# Patient Record
Sex: Male | Born: 1995 | Race: Black or African American | Hispanic: No | Marital: Single | Smoking: Never smoker
Health system: Southern US, Community
[De-identification: ages and names within clinical notes are randomized; demographics above are authoritative.]

## PROBLEM LIST (undated history)

## (undated) DIAGNOSIS — J45909 Unspecified asthma, uncomplicated: Secondary | ICD-10-CM

---

## 2017-03-30 ENCOUNTER — Emergency Department (HOSPITAL_COMMUNITY): Payer: No Typology Code available for payment source

## 2017-03-30 ENCOUNTER — Other Ambulatory Visit: Payer: Self-pay

## 2017-03-30 ENCOUNTER — Emergency Department (HOSPITAL_COMMUNITY)
Admission: EM | Admit: 2017-03-30 | Discharge: 2017-03-31 | Disposition: A | Discharge status: Home / Self Care | Payer: No Typology Code available for payment source | Attending: Emergency Medicine | Admitting: Emergency Medicine

## 2017-03-30 ENCOUNTER — Encounter (HOSPITAL_COMMUNITY): Payer: Self-pay

## 2017-03-30 DIAGNOSIS — M545 Low back pain, unspecified: Secondary | ICD-10-CM

## 2017-03-30 DIAGNOSIS — Z202 Contact with and (suspected) exposure to infections with a predominantly sexual mode of transmission: Secondary | ICD-10-CM | POA: Insufficient documentation

## 2017-03-30 DIAGNOSIS — M542 Cervicalgia: Secondary | ICD-10-CM | POA: Insufficient documentation

## 2017-03-30 DIAGNOSIS — J45909 Unspecified asthma, uncomplicated: Secondary | ICD-10-CM | POA: Insufficient documentation

## 2017-03-30 DIAGNOSIS — Z711 Person with feared health complaint in whom no diagnosis is made: Secondary | ICD-10-CM

## 2017-03-30 HISTORY — DX: Unspecified asthma, uncomplicated: J45.909

## 2017-03-30 LAB — URINALYSIS, ROUTINE W REFLEX MICROSCOPIC
BILIRUBIN URINE: NEGATIVE
Glucose, UA: NEGATIVE mg/dL
HGB URINE DIPSTICK: NEGATIVE
Ketones, ur: NEGATIVE mg/dL
Leukocytes, UA: NEGATIVE
Nitrite: NEGATIVE
PH: 5 (ref 5.0–8.0)
Protein, ur: NEGATIVE mg/dL
SPECIFIC GRAVITY, URINE: 1.028 (ref 1.005–1.030)

## 2017-03-30 NOTE — ED Notes (Signed)
Patient transported to X-ray 
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
## 2017-03-30 NOTE — ED Notes (Signed)
Patient transported to CT 
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
## 2017-03-31 LAB — HIV ANTIBODY (ROUTINE TESTING W REFLEX): HIV SCREEN 4TH GENERATION: NONREACTIVE

## 2017-03-31 LAB — GC/CHLAMYDIA PROBE AMP (~~LOC~~) NOT AT ARMC
Chlamydia: NEGATIVE
Neisseria Gonorrhea: NEGATIVE

## 2017-03-31 LAB — RPR: RPR Ser Ql: NONREACTIVE

## 2017-03-31 MED ORDER — CYCLOBENZAPRINE HCL 10 MG PO TABS: 10.0000 mg | ORAL_TABLET | Freq: Two times a day (BID) | ORAL | 0 refills | Status: AC | PRN

## 2017-03-31 NOTE — Discharge Instructions (Signed)
Take Ibuprofen three times daily for the next week. Take this medicine with food. °Take muscle relaxer at bedtime to help you sleep. This medicine makes you drowsy so do not take before driving or work °Use a heating pad for sore muscles - use for 20 minutes several times a day °Return for worsening symptoms ° °

## 2017-03-31 NOTE — ED Notes (Signed)
ED Provider at bedside. 

## 2018-02-16 ENCOUNTER — Emergency Department (HOSPITAL_COMMUNITY)
Admission: EM | Admit: 2018-02-16 | Discharge: 2018-02-16 | Disposition: A | Discharge status: Home / Self Care | Payer: Medicaid Other | Attending: Emergency Medicine | Admitting: Emergency Medicine

## 2018-02-16 ENCOUNTER — Encounter (HOSPITAL_COMMUNITY): Payer: Self-pay | Admitting: Emergency Medicine

## 2018-02-16 ENCOUNTER — Other Ambulatory Visit: Payer: Self-pay

## 2018-02-16 DIAGNOSIS — Z5321 Procedure and treatment not carried out due to patient leaving prior to being seen by health care provider: Secondary | ICD-10-CM | POA: Insufficient documentation

## 2018-02-16 DIAGNOSIS — Z043 Encounter for examination and observation following other accident: Secondary | ICD-10-CM | POA: Insufficient documentation

## 2018-02-16 NOTE — ED Notes (Signed)
Pt states he is leaving because he isn't bleeding anymore
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
